# Patient Record
Sex: Female | Born: 1995
Health system: Southern US, Community
[De-identification: ages and names within clinical notes are randomized; demographics above are authoritative.]

## PROBLEM LIST (undated history)

## (undated) DIAGNOSIS — L209 Atopic dermatitis, unspecified: Secondary | ICD-10-CM

## (undated) HISTORY — DX: Atopic dermatitis, unspecified: L20.9

---

## 2017-09-08 ENCOUNTER — Other Ambulatory Visit: Payer: Self-pay | Admitting: Family Medicine

## 2017-09-08 ENCOUNTER — Other Ambulatory Visit (HOSPITAL_COMMUNITY)
Admission: RE | Admit: 2017-09-08 | Discharge: 2017-09-08 | Disposition: A | Discharge status: Home / Self Care | Payer: BC Managed Care – PPO | Source: Ambulatory Visit | Attending: Family Medicine | Admitting: Family Medicine

## 2017-09-08 DIAGNOSIS — Z124 Encounter for screening for malignant neoplasm of cervix: Secondary | ICD-10-CM | POA: Diagnosis not present

## 2017-09-09 LAB — CYTOLOGY - PAP
Diagnosis: NEGATIVE
HPV (WINDOPATH): NOT DETECTED

## 2018-05-07 ENCOUNTER — Other Ambulatory Visit: Payer: Self-pay | Admitting: Family Medicine

## 2018-05-07 DIAGNOSIS — N6310 Unspecified lump in the right breast, unspecified quadrant: Secondary | ICD-10-CM

## 2018-05-13 ENCOUNTER — Other Ambulatory Visit: Payer: Self-pay | Admitting: Family Medicine

## 2018-05-13 ENCOUNTER — Encounter (INDEPENDENT_AMBULATORY_CARE_PROVIDER_SITE_OTHER): Payer: Self-pay

## 2018-05-13 ENCOUNTER — Ambulatory Visit
Admission: RE | Admit: 2018-05-13 | Discharge: 2018-05-13 | Disposition: A | Discharge status: Home / Self Care | Payer: BC Managed Care – PPO | Source: Ambulatory Visit | Attending: Family Medicine | Admitting: Family Medicine

## 2018-05-13 DIAGNOSIS — N6310 Unspecified lump in the right breast, unspecified quadrant: Secondary | ICD-10-CM

## 2018-05-14 ENCOUNTER — Ambulatory Visit
Admission: RE | Admit: 2018-05-14 | Discharge: 2018-05-14 | Disposition: A | Discharge status: Home / Self Care | Payer: BC Managed Care – PPO | Source: Ambulatory Visit | Attending: Family Medicine | Admitting: Family Medicine

## 2018-05-14 ENCOUNTER — Other Ambulatory Visit: Payer: Self-pay | Admitting: Family Medicine

## 2018-05-14 DIAGNOSIS — N6310 Unspecified lump in the right breast, unspecified quadrant: Secondary | ICD-10-CM

## 2019-11-02 ENCOUNTER — Other Ambulatory Visit: Payer: Self-pay

## 2019-11-02 NOTE — Telephone Encounter (Signed)
Phone call to patient to schedule office visit before will refill Dupixent. Voicemail left

## 2019-11-29 IMAGING — US ULTRASOUND RIGHT BREAST LIMITED
1 series · 13 of 17 positions shown · non-contrast
Comparison: None.

ADDENDUM:
The patient presented for 2 ultrasound-guided right breast biopsies
today. The patient told the medical assistant and me that she is
moving today, with a plan to move boxes, etc all day long. I met
with the patient and we discussed that we recommend no lifting over
5-10 pounds on the day of biopsies, and offered to reschedule the
patient next week. The patient also states that she would like the
masses surgically excised. She requested a surgical consultation be
made.

Surgical consultation has been arranged with Dr. Dovid Mohamud at
[REDACTED] on May 19, 2018.
Pathology results reported by Lorraine Jim, RN on 05/14/2018.
When speaking with the patient to inform of the surgical
consultation appointment we made for her, she informed me she had
moved to [place] yesterday and would need a surgical
consultation in [place]. The patient will have her PCP or OBGYN make
a surgical consultation to someone in her area.
Results reported by Lorraine Jim, RN on 05/15/2018.
CLINICAL DATA: 22-year-old female presenting for evaluation of 2
palpable lumps in the right breast identified on self breast exam.
She noticed these within the past few months.
EXAM:
ULTRASOUND OF THE RIGHT BREAST

[Series 1: ultrasound right breast limited · 0.06mm/px · 13 of 17 slices shown]
[im 1/17]
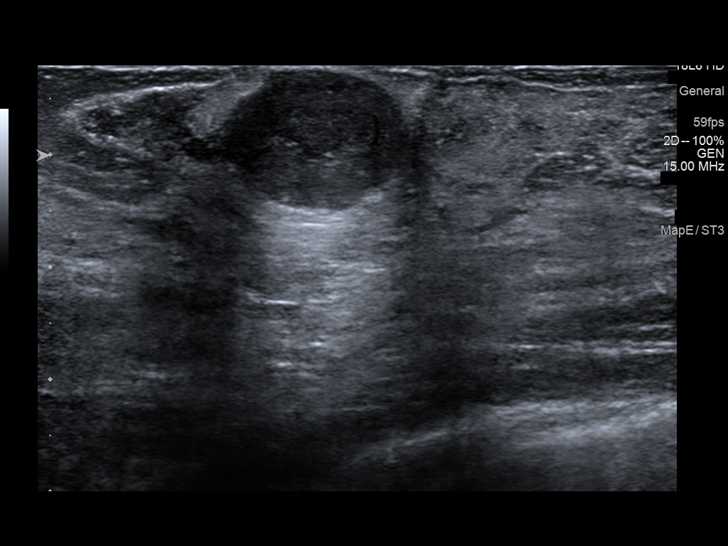
[im 2/17]
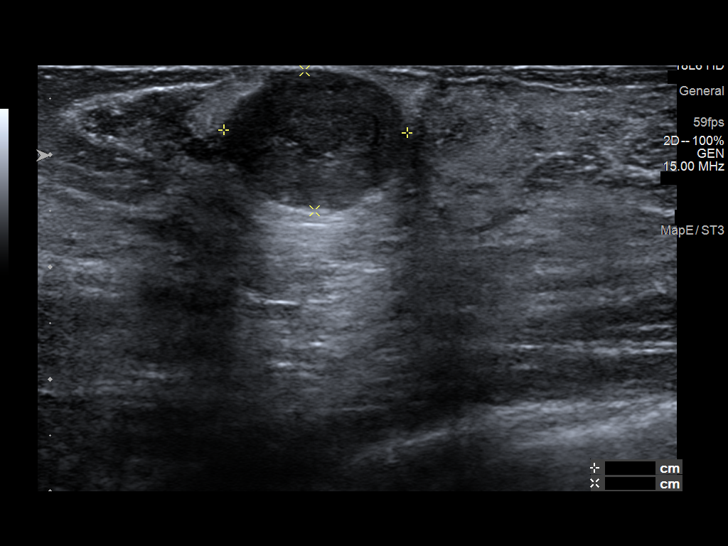
[im 4/17]
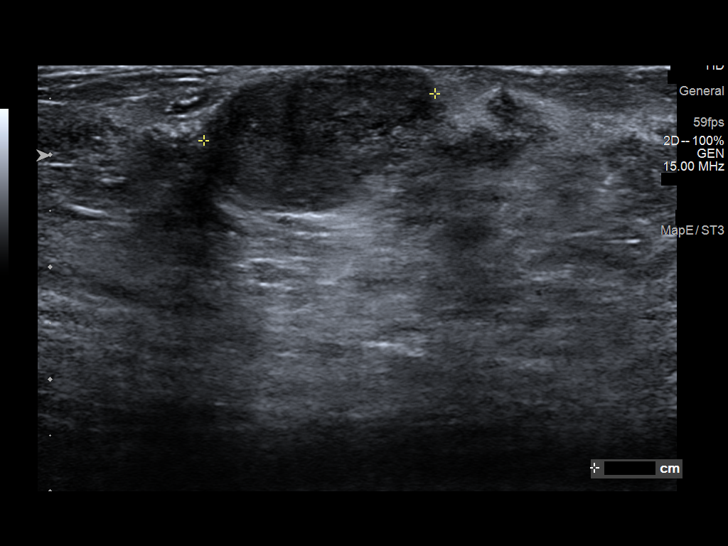
[im 5/17]
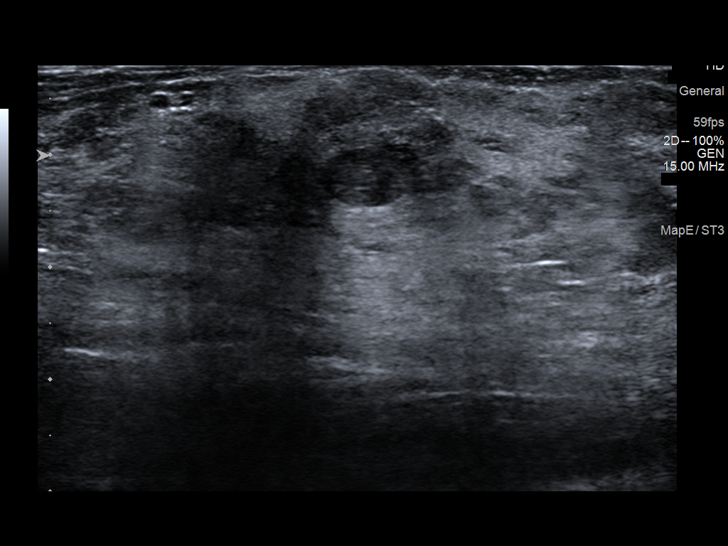
[im 6/17]
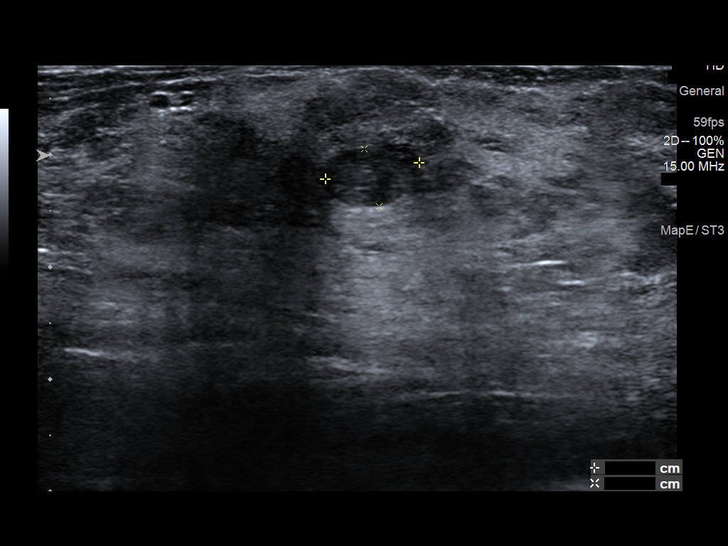
[im 8/17]
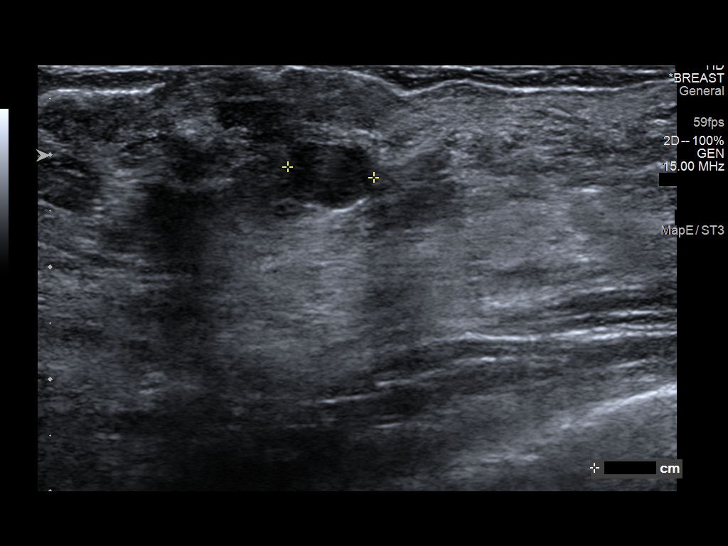
[im 9/17]
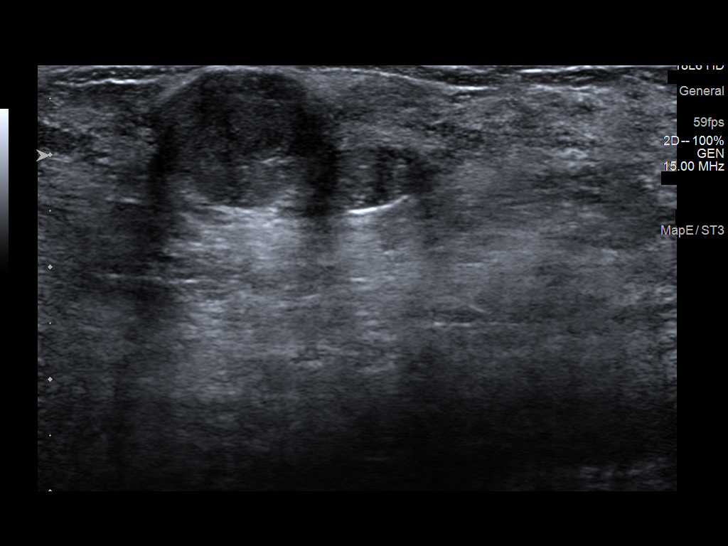
[im 10/17]
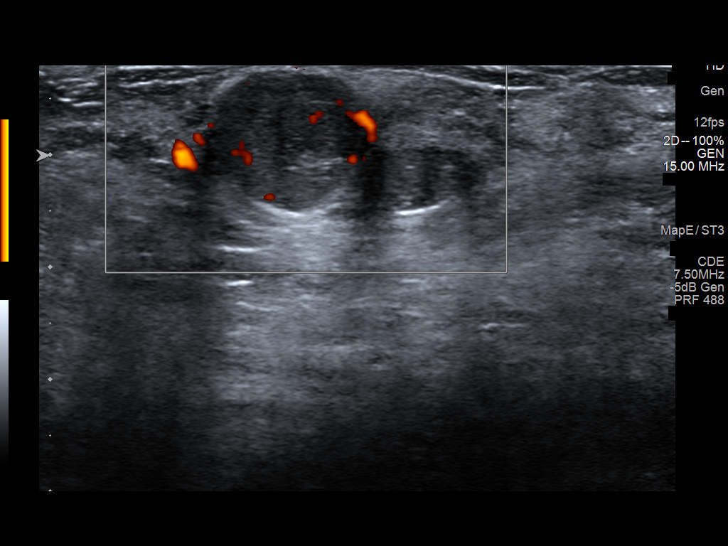
[im 12/17]
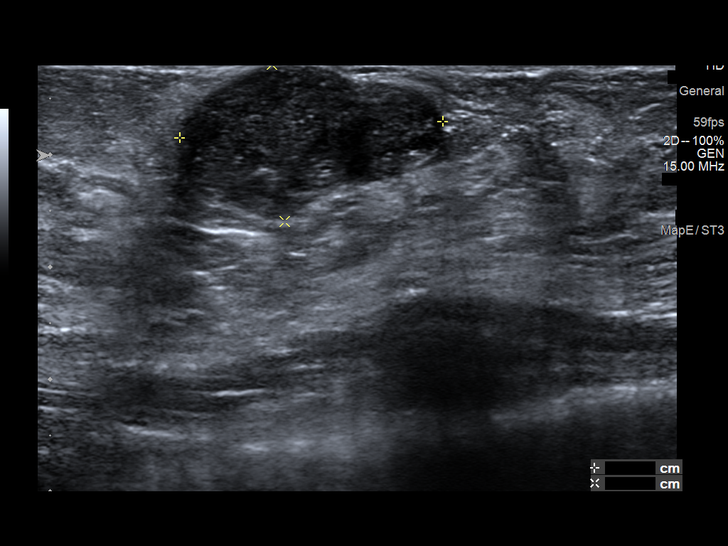
[im 13/17]
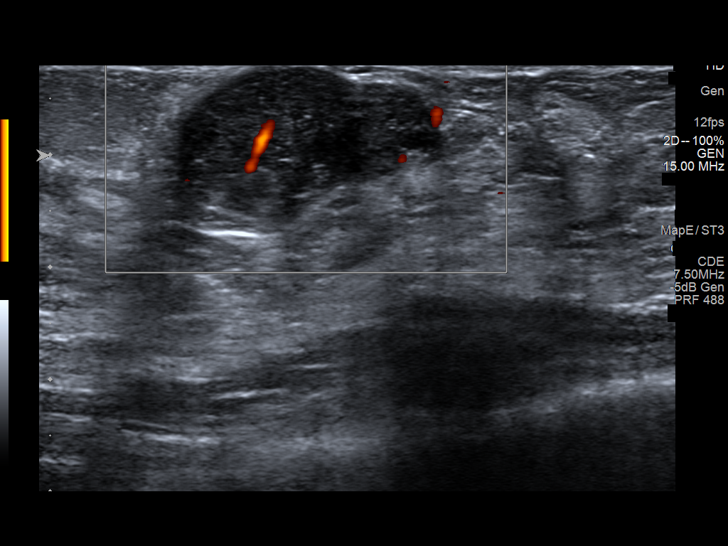
[im 14/17]
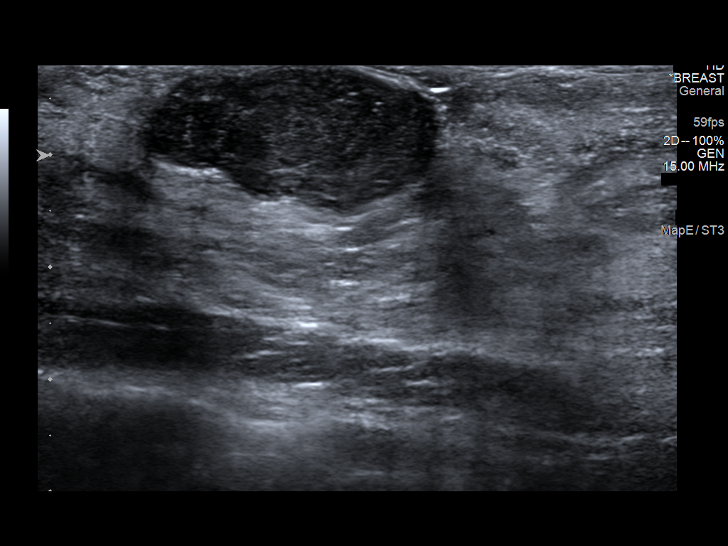
[im 16/17]
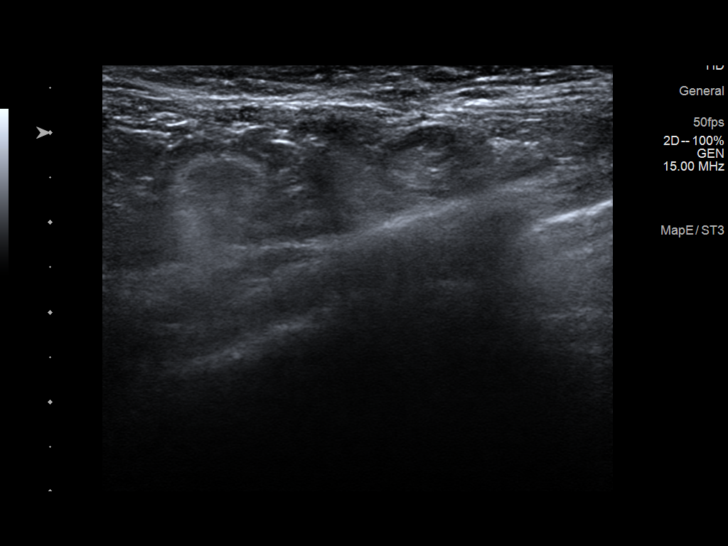
[im 17/17]
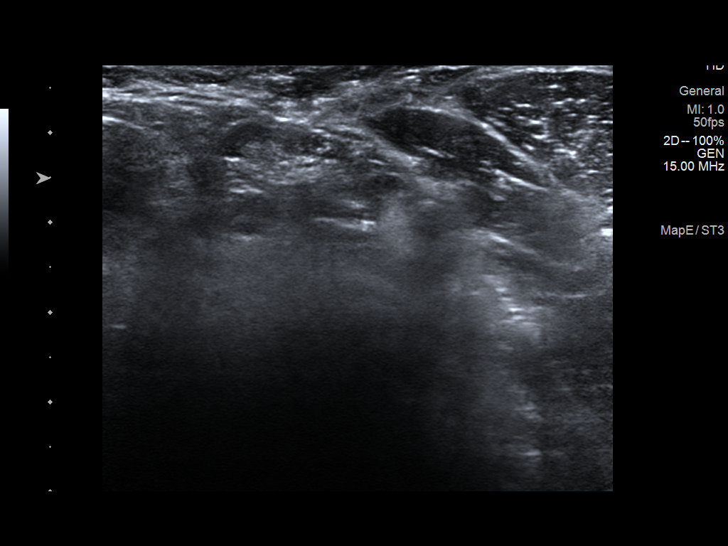

[13 of 17 positions shown; findings below may reference images not displayed]

FINDINGS: Ultrasound of the right breast at [DATE], 6 cm from the nipple
demonstrates 2 abutting oval hypoechoic masses. The larger measures
2.1 x 1.3 x 1.6 cm, and the smaller measures 0.9 x 0.5 x 0.8 cm.

Ultrasound of the right breast at 9 o'clock, 3 cm from the nipple
demonstrates an oval circumscribed hypoechoic mass measuring 2.4 x
1.4 x 2.6 cm.
IMPRESSION: 1. There are 2 abutting masses in the right breast at [DATE], favored
to represent fibroadenomas.

2. There is a mass in the right breast at 9 o'clock demonstrates a
2.6 cm mass, favored to represent a fibroadenoma.

RECOMMENDATION:
The patient desires surgical excision, therefore ultrasound guided
biopsy is recommended for the 2 abutting right breast masses at
[DATE] which can be sample together, and the right breast mass at 9
o'clock.

I have discussed the findings and recommendations with the patient.
Results were also provided in writing at the conclusion of the
visit. If applicable, a reminder letter will be sent to the patient
regarding the next appointment.

BI-RADS CATEGORY  4: Suspicious.

## 2020-03-22 ENCOUNTER — Other Ambulatory Visit: Payer: Self-pay | Admitting: Physician Assistant

## 2020-03-22 NOTE — Telephone Encounter (Signed)
Last visit Wednesday March 04 2018 needs ov for refill

## 2020-05-30 ENCOUNTER — Other Ambulatory Visit: Payer: Self-pay | Admitting: Physician Assistant

## 2020-06-05 ENCOUNTER — Telehealth: Payer: Self-pay | Admitting: *Deleted

## 2020-06-05 NOTE — Telephone Encounter (Signed)
Pharmacy called refills denied on Dupixent- patient needs office visit.
